# Patient Record
Sex: Male | Born: 1990 | Hispanic: Yes | Smoking: Never smoker
Health system: Southern US, Community
[De-identification: ages and names within clinical notes are randomized; demographics above are authoritative.]

## PROBLEM LIST (undated history)

## (undated) DIAGNOSIS — F419 Anxiety disorder, unspecified: Secondary | ICD-10-CM

## (undated) DIAGNOSIS — G610 Guillain-Barre syndrome: Secondary | ICD-10-CM

## (undated) DIAGNOSIS — F32A Depression, unspecified: Secondary | ICD-10-CM

## (undated) DIAGNOSIS — K219 Gastro-esophageal reflux disease without esophagitis: Secondary | ICD-10-CM

## (undated) HISTORY — DX: Gastro-esophageal reflux disease without esophagitis: K21.9

## (undated) HISTORY — DX: Guillain-Barre syndrome: G61.0

## (undated) HISTORY — DX: Depression, unspecified: F32.A

---

## 2017-05-19 ENCOUNTER — Encounter (HOSPITAL_COMMUNITY): Payer: Self-pay | Admitting: Emergency Medicine

## 2017-05-19 DIAGNOSIS — R51 Headache: Secondary | ICD-10-CM | POA: Insufficient documentation

## 2017-05-19 DIAGNOSIS — R030 Elevated blood-pressure reading, without diagnosis of hypertension: Secondary | ICD-10-CM | POA: Diagnosis not present

## 2017-05-19 DIAGNOSIS — Z7982 Long term (current) use of aspirin: Secondary | ICD-10-CM | POA: Insufficient documentation

## 2017-05-19 NOTE — ED Triage Notes (Signed)
Pt in reporting migraine, nausea, HTN X2-3 days. A/OX4, ambulatory. No neuro deficits. No hx migraine.

## 2017-05-20 ENCOUNTER — Emergency Department (HOSPITAL_COMMUNITY)
Admission: EM | Admit: 2017-05-20 | Discharge: 2017-05-20 | Disposition: A | Discharge status: Home / Self Care | Payer: BLUE CROSS/BLUE SHIELD | Attending: Emergency Medicine | Admitting: Emergency Medicine

## 2017-05-20 DIAGNOSIS — R51 Headache: Secondary | ICD-10-CM

## 2017-05-20 DIAGNOSIS — R030 Elevated blood-pressure reading, without diagnosis of hypertension: Secondary | ICD-10-CM

## 2017-05-20 DIAGNOSIS — R519 Headache, unspecified: Secondary | ICD-10-CM

## 2017-05-20 MED ORDER — SODIUM CHLORIDE 0.9 % IV BOLUS (SEPSIS)
1000.0000 mL | Freq: Once | INTRAVENOUS | Status: AC
  Administered 2017-05-20: 1000 mL via INTRAVENOUS

## 2017-05-20 MED ORDER — DIPHENHYDRAMINE HCL 50 MG/ML IJ SOLN
25.0000 mg | Freq: Once | INTRAMUSCULAR | Status: AC
  Administered 2017-05-20: 25 mg via INTRAVENOUS
  Filled 2017-05-20: qty 1

## 2017-05-20 MED ORDER — ACETAMINOPHEN 325 MG PO TABS
650.0000 mg | ORAL_TABLET | Freq: Once | ORAL | Status: AC
  Administered 2017-05-20: 650 mg via ORAL
  Filled 2017-05-20: qty 2

## 2017-05-20 MED ORDER — METOCLOPRAMIDE HCL 5 MG/ML IJ SOLN
10.0000 mg | Freq: Once | INTRAMUSCULAR | Status: AC
  Administered 2017-05-20: 10 mg via INTRAVENOUS
  Filled 2017-05-20: qty 2

## 2017-05-20 MED ORDER — DEXAMETHASONE SODIUM PHOSPHATE 10 MG/ML IJ SOLN
10.0000 mg | Freq: Once | INTRAMUSCULAR | Status: AC
Start: 2017-05-20 — End: 2017-05-20
  Administered 2017-05-20: 10 mg via INTRAVENOUS
  Filled 2017-05-20: qty 1

## 2017-05-20 NOTE — ED Notes (Signed)
Pt departed in NAD, refused use of wheelchair.  

## 2017-05-20 NOTE — ED Provider Notes (Signed)
[facility] Provider Note   CSN: [id number] Arrival date & time: 05/19/17  2019     History   Chief Complaint Chief Complaint  Patient presents with  . Hypertension  . Migraine    HPI Shawn Cameron is a 26 y.o. male.  Shawn Skeetersaron D Mascaro is a 26 y.o. Male presents the emergency department complaining of a frontal headache ongoing for the past 2 days. Patient reports gradual onset of frontal headache 2 days ago. He also reports checking his blood pressure this morning and noted to be 170 systolic. He's been told he has elevated blood pressures previously but is not on blood pressure medication. He reports taking Excedrin Migraine with little relief of this pain today. He denies any aggravating or alleviating factors for his headache. No exertional headache. Currently he reports his headache is a 5 out of 10 and frontal. He reports hornlike pattern distribution bilaterally of his headache. He also reports some pain to his bilateral shoulders across his trapezius musculature. He reports some associated nausea earlier without vomiting. He reports being more stressed recently. He denies fevers, sudden onset headache, neck pain, neck stiffness, chest pain, shortness of breath, abdominal pain, vomiting, urinary symptoms, numbness, tingling, weakness, changes to his vision, double vision, or rashes.   The history is provided by the patient and medical records. No language interpreter was used.  Hypertension  Associated symptoms include headaches. Pertinent negatives include no chest pain, no abdominal pain and no shortness of breath.  Migraine  Associated symptoms include headaches. Pertinent negatives include no chest pain, no abdominal pain and no shortness of breath.    History reviewed. No pertinent past medical history.  There are no active problems to display for this patient.   History reviewed. No pertinent surgical history.     Home Medications    Prior to Admission  medications   Medication Sig Start Date End Date Taking? Authorizing Provider  aspirin-acetaminophen-caffeine (EXCEDRIN MIGRAINE) [phone] MG tablet Take 1 tablet by mouth every 6 (six) hours as needed for headache.   Yes [provider]    Family History No family history on file.  Social History Social History  Substance Use Topics  . Smoking status: Not on file  . Smokeless tobacco: Not on file  . Alcohol use Not on file     Allergies   Patient has no known allergies.   Review of Systems Review of Systems  Constitutional: Negative for chills and fever.  HENT: Negative for congestion and sore throat.   Eyes: Negative for pain and visual disturbance.  Respiratory: Negative for cough, shortness of breath and wheezing.   Cardiovascular: Negative for chest pain and palpitations.  Gastrointestinal: Negative for abdominal pain, diarrhea, nausea and vomiting.  Genitourinary: Negative for dysuria.  Musculoskeletal: Positive for myalgias. Negative for back pain, neck pain and neck stiffness.  Skin: Negative for rash and wound.  Neurological: Positive for headaches. Negative for light-headedness.     Physical Exam Updated Vital Signs BP 129/85   Pulse 83   Temp 98.2 F (36.8 C) (Oral)   Resp 18   Ht 5\' 11"  (1.803 m)   Wt 104.3 kg (230 lb)   SpO2 99%   BMI 32.08 kg/m   Physical Exam  Constitutional: He is oriented to person, place, and time. He appears well-developed and well-nourished. No distress.  Nontoxic appearing.  HENT:  Head: Normocephalic and atraumatic.  Right Ear: External ear normal.  Left Ear: External ear normal.  Mouth/Throat: Oropharynx  is clear and moist.  Bilateral tympanic membranes are pearly-gray without erythema or loss of landmarks.  No temporal edema or tenderness.  Eyes: Conjunctivae and EOM are normal. Pupils are equal, round, and reactive to light. Right eye exhibits no discharge. Left eye exhibits no discharge.  Neck: Normal  range of motion. Neck supple. No JVD present. No tracheal deviation present.  No meningeal signs.   Cardiovascular: Normal rate, regular rhythm, normal heart sounds and intact distal pulses.  Exam reveals no gallop and no friction rub.   No murmur heard. Pulmonary/Chest: Effort normal and breath sounds normal. No stridor. No respiratory distress. He has no wheezes. He has no rales.  Lungs are clear to ascultation bilaterally. Symmetric chest expansion bilaterally. No increased work of breathing. No rales or rhonchi.    Abdominal: Soft. Bowel sounds are normal. There is no tenderness. There is no guarding.  Abdomen is soft nontender to palpation.  Musculoskeletal: Normal range of motion. He exhibits tenderness. He exhibits no edema.  TTP across his bilateral trapezius musculature. Patient is spontaneously moving all extremities in a coordinated fashion exhibiting good strength.   Lymphadenopathy:    He has no cervical adenopathy.  Neurological: He is alert and oriented to person, place, and time. He displays normal reflexes. No cranial nerve deficit or sensory deficit. He exhibits normal muscle tone. Coordination normal.  The patient is alert and oriented 3. Cranial nerves are intact. Speech is clear and coherent. No pronator drift. Finger to nose intact bilaterally. EOMs are intact. Vision is grossly intact. Normal gait. Sensation is intact his bilateral upper and lower extremities. Bilateral patellar DTRs are intact.  Skin: Skin is warm and dry. Capillary refill takes less than 2 seconds. No rash noted. He is not diaphoretic. No erythema. No pallor.  Psychiatric: He has a normal mood and affect. His behavior is normal.  Nursing note and vitals reviewed.    ED Treatments / Results  Labs (all labs ordered are listed, but only abnormal results are displayed) Labs Reviewed - No data to display  EKG  EKG Interpretation None       Radiology No results found.  Procedures Procedures  (including critical care time)  Medications Ordered in ED Medications  sodium chloride 0.9 % bolus 1,000 mL (1,000 mLs Intravenous New Bag/Given 05/20/17 0510)  metoCLOPramide (REGLAN) injection 10 mg (10 mg Intravenous Given 05/20/17 0511)  diphenhydrAMINE (BENADRYL) injection 25 mg (25 mg Intravenous Given 05/20/17 0515)  dexamethasone (DECADRON) injection 10 mg (10 mg Intravenous Given 05/20/17 0513)  acetaminophen (TYLENOL) tablet 650 mg (650 mg Oral Given 05/20/17 0510)     Initial Impression / Assessment and Plan / ED Course  I have reviewed the triage vital signs and the nursing notes.  Pertinent labs & imaging results that were available during my care of the patient were reviewed by me and considered in my medical decision making (see chart for details).    This is a 26 y.o. Male presents the emergency department complaining of a frontal headache ongoing for the past 2 days. Patient reports gradual onset of frontal headache 2 days ago. He also reports checking his blood pressure this morning and noted to be 170 systolic. He's been told he has elevated blood pressures previously but is not on blood pressure medication. He reports taking Excedrin Migraine with little relief of this pain today. He denies any aggravating or alleviating factors for his headache. No exertional headache. Currently he reports his headache is a 5 out of  10 and frontal. He reports hornlike pattern distribution bilaterally of his headache. He also reports some pain to his bilateral shoulders across his trapezius musculature. He reports some associated nausea earlier without vomiting. He reports being more stressed recently. He denies fevers, sudden onset headache, neck pain, neck stiffness. . On exam the patient is afebrile and non-toxic appearing. He has no focal neurological deficits. Blood pressure is 124/79 on my exam. He does have some tenderness across his trapezius musculature bilaterally. Suspect this could cause  tension headache as well. Presentation is not concerning for [facility], ICH, Meningitis, or temporal arteritis. Pt is afebrile with no focal neuro deficits, nuchal rigidity, or change in vision.  I reevaluation patient reports his headache is completely resolved after migraine cocktail. Plan for discharge. I did encourage him to follow-up with his primary care doctor to have his blood pressure rechecked. I suspect his blood pressure was elevated today due to some pain. Will have him recheck this to ensure this is due to pain. Prior to discharge his blood pressure is 116/69. I advised the patient to follow-up with their primary care provider this week. I advised the patient to return to the emergency department with new or worsening symptoms or new concerns. The patient verbalized understanding and agreement with plan.      Final Clinical Impressions(s) / ED Diagnoses   Final diagnoses:  Bad headache  Elevated blood pressure reading    New Prescriptions New Prescriptions   No medications on file     Everlene Farrier, Cordelia Poche 05/20/17 1610    Devoria Albe, MD 05/20/17 [phone]

## 2017-05-21 ENCOUNTER — Emergency Department (HOSPITAL_COMMUNITY): Payer: BLUE CROSS/BLUE SHIELD

## 2017-05-21 ENCOUNTER — Emergency Department (HOSPITAL_COMMUNITY)
Admission: EM | Admit: 2017-05-21 | Discharge: 2017-05-21 | Disposition: A | Discharge status: Home / Self Care | Payer: BLUE CROSS/BLUE SHIELD | Attending: Emergency Medicine | Admitting: Emergency Medicine

## 2017-05-21 ENCOUNTER — Encounter (HOSPITAL_COMMUNITY): Payer: Self-pay

## 2017-05-21 DIAGNOSIS — R0789 Other chest pain: Secondary | ICD-10-CM | POA: Insufficient documentation

## 2017-05-21 DIAGNOSIS — G4489 Other headache syndrome: Secondary | ICD-10-CM | POA: Diagnosis not present

## 2017-05-21 DIAGNOSIS — R51 Headache: Secondary | ICD-10-CM | POA: Diagnosis present

## 2017-05-21 LAB — I-STAT TROPONIN, ED: TROPONIN I, POC: 0.01 ng/mL (ref 0.00–0.08)

## 2017-05-21 MED ORDER — SODIUM CHLORIDE 0.9 % IV BOLUS (SEPSIS)
1000.0000 mL | Freq: Once | INTRAVENOUS | Status: AC
  Administered 2017-05-21: 1000 mL via INTRAVENOUS

## 2017-05-21 MED ORDER — KETOROLAC TROMETHAMINE 30 MG/ML IJ SOLN
30.0000 mg | Freq: Once | INTRAMUSCULAR | Status: AC
  Administered 2017-05-21: 30 mg via INTRAVENOUS
  Filled 2017-05-21: qty 1

## 2017-05-21 MED ORDER — DIPHENHYDRAMINE HCL 50 MG/ML IJ SOLN
25.0000 mg | Freq: Once | INTRAMUSCULAR | Status: AC
  Administered 2017-05-21: 25 mg via INTRAVENOUS
  Filled 2017-05-21: qty 1

## 2017-05-21 MED ORDER — METOCLOPRAMIDE HCL 5 MG/ML IJ SOLN
10.0000 mg | Freq: Once | INTRAMUSCULAR | Status: AC
  Administered 2017-05-21: 10 mg via INTRAVENOUS
  Filled 2017-05-21: qty 2

## 2017-05-21 NOTE — ED Triage Notes (Signed)
Pt was seen last night for headache which has returned. Pt also c/o of central CP that started today with some nausea, denies SOB or radiation.

## 2017-05-21 NOTE — ED Provider Notes (Signed)
By signing my name below, I, Shawn Cameron, attest that this documentation has been prepared under the direction and in the presence of [name], [name], DO. Electronically Signed: Diona Cameron, ED Scribe. 05/21/17. 3:54 AM.  TIME SEEN: 3:35 AM  CHIEF COMPLAINT: Headache  HPI: Shawn Cameron is a 26 y.o. 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 Pt was seen on 05/19/17 for similar sx and was given a migraine cocktail which alleviated his sx. Around 3 pm on 05/20/17, he took Excedrin with mild relief as he states he felt his headache was coming back. States tonight around 6 PM headache started coming back again has diffuse, throbbing in nature. Reports tingling in both hands and on the left side of the face. No focal weakness. No head injury. Not on anticoagulation or antiplatelets. Has had similar headaches before. Denies at this was a sudden onset, worst headache of his life, thunderclap headache. States he does have some pressure behind both eyes and nausea but no vomiting. No aggravating or alleviating factors for his headache other than Excedrin, migraine cocktail.  Additionally he c/o of a gradually worsening, mild central CP that started ~ 2 days ago. Applying pressure to his chest temporarily alleviates his discomfort. States he is only able to describe this as a discomfort.  He reports being stressed and anxious and he thinks this is making his symptoms worse.  No history of PE, DVT, exogenous estrogen use, recent fractures, surgery, trauma, hospitalization or prolonged travel. No lower extremity swelling or pain. No calf tenderness. Denies family history of premature CAD. No fevers or cough.  ROS: See HPI Constitutional: no fever  Eyes: no drainage  ENT: no runny nose   Cardiovascular: + chest pain  Resp: no SOB  GI: no  vomiting GU: no dysuria Integumentary: no rash  Allergy: no hives  Musculoskeletal: no leg swelling  Neurological: no slurred speech ROS otherwise negative  PAST MEDICAL HISTORY/PAST SURGICAL HISTORY:  History reviewed. No pertinent past medical history.  MEDICATIONS:  Prior to Admission medications   Medication Sig Start Date End Date Taking? Authorizing Provider  aspirin-acetaminophen-caffeine (EXCEDRIN MIGRAINE) [phone] MG tablet Take 1 tablet by mouth every 6 (six) hours as needed for headache.    [provider]    ALLERGIES:  No Known Allergies  SOCIAL HISTORY:  Social History  Substance Use Topics  . Smoking status: Not on file  . Smokeless tobacco: Not on file  . Alcohol use Not on file    FAMILY HISTORY: No family history on file.  EXAM: BP (!) 142/84   Pulse 84   Temp 97.4 F (36.3 C)   Resp 18   SpO2 98%    CONSTITUTIONAL: Alert and oriented and responds appropriately to questions. Well-appearing; well-nourished HEAD: Normocephalic EYES: Conjunctivae clear, pupils appear equal, EOMI ENT: normal nose; moist mucous membranes; No pharyngeal erythema or petechiae, no tonsillar hypertrophy or exudate, no uvular deviation, no unilateral swelling, no trismus or drooling, no muffled voice, normal phonation, no stridor, no dental caries present, no drainable dental abscess noted, no Ludwig's angina, tongue sits flat in the bottom of the mouth, no angioedema, no facial erythema or warmth, no facial swelling; no pain with movement of the neck. NECK: Supple, no meningismus, no nuchal rigidity, no LAD  CARD: RRR; S1 and S2 appreciated; no murmurs,  no clicks, no rubs, no gallops CHEST:  Chest wall is not tender to palpation over the left chest wall but he reports palpating this area makes his pain better.  No crepitus, ecchymosis, erythema, warmth, rash or other lesions present.   RESP: Normal chest excursion without splinting or tachypnea; breath sounds clear  and equal bilaterally; no wheezes, no rhonchi, no rales, no hypoxia or respiratory distress, speaking full sentences ABD/GI: Normal bowel sounds; non-distended; soft, non-tender, no rebound, no guarding, no peritoneal signs, no hepatosplenomegaly BACK:  The back appears normal and is non-tender to palpation, there is no CVA tenderness EXT: Normal ROM in all joints; non-tender to palpation; no edema; normal capillary refill; no cyanosis, no calf tenderness or swelling    SKIN: Normal color for age and race; warm; no rash NEURO: Moves all extremities equally; Strength 5/5 in all four extremities except with the left hand and wrist has decreased strength with grip strength and wrist flexion and extension which is chronic.  Normal sensation diffusely.  CN 2-12 grossly intact.  No dysmetria to finger to nose testing bilaterally.  Normal speech.  Normal gait. PSYCH: The patient's mood and manner are appropriate. Grooming and personal hygiene are appropriate.  MEDICAL DECISION MAKING: Patient here with atypical chest pain. Troponin ordered in triage is unremarkable. Chest x-ray clear. He is PERC negative. Doubt dissection. Suspect chest wall pain. We'll treat with Toradol and reassess. EKG does show some nonspecific intraventricular conduction delay but no other abnormality. No old for comparison.   Patient also complaining of diffuse headache. It was not sudden onset, thunderclap. Not the worst taking his life. He has had similar headaches. Reports tingling in his left face and bilateral hands but on my examination he has normal sensation diffusely. Has some left hand and wrist weakness on my examination which is chronic from his history of Guillain-Barr. No other focal neurologic deficits. Doubt intracranial hemorrhage, meningitis, stroke. I do not feel needs head imaging and have discussed with him that I feel a head CT would expose him to unnecessary radiation at the risk of this test would outweigh any  benefit. He is comfortable with this plan. We'll treat him again with migraine cocktail including Toradol, Reglan, Benadryl, IV fluids and reassess.  ED PROGRESS: Chem-8 shows sodium 139, potassium 3.8, chloride 103, calcium 1.14, CO2 26, glucose 64, BUN 6, creatinine 0.6, hematocrit 31, hemoglobin 10.5, anion gap 15.  5:25 AM  Pt's headache almost completely gone after migraine cocktail. Chest pain is completely resolved. He reports he feels much better and reassured after blood work, chest x-ray and long discussion. I suspect there is a component of anxiety present he agrees. Will get outpatient PCP and neurology follow-up. Have recommend alternating Tylenol and Motrin as needed for pain. Discussed return precautions.   At this time, I do not feel there is any life-threatening condition present. I have reviewed and discussed all results (EKG, imaging, lab, urine as appropriate) and exam findings with patient/family. I have reviewed nursing notes and appropriate previous records.  I feel the patient is safe to be discharged home without further emergent workup and can continue workup as an outpatient as needed. Discussed usual and customary return precautions. Patient/family verbalize understanding and are comfortable with this plan.  Outpatient follow-up has been provided if needed. All questions have been answered.    EKG Interpretation  Date/Time:  Friday May 21 2017 03:04:36 EDT Ventricular Rate:  87 PR Interval:  166 QRS Duration: 126 QT Interval:  386 QTC Calculation: 464 R Axis:   84 Text Interpretation:  Normal sinus rhythm Non-specific intra-ventricular conduction block Abnormal ECG No old tracing to compare Confirmed by [name], Baxter HireKristen [phone] on 05/21/2017 3:13:12 AM      I personally performed the services described in this documentation, which was scribed in my presence. The recorded information has been reviewed and is accurate.     [name], Layla MawKristen N, DO 05/21/17 [phone]

## 2017-05-21 NOTE — Discharge Instructions (Signed)
You may alternate Tylenol 1000 mg every 6 hours as needed for pain and Ibuprofen 800 mg every 8 hours as needed for pain.  Please take Ibuprofen with food. ° ° ° °To find a primary care or specialty doctor please call [phone] or [phone] to access "[facility] Find a Doctor Service." ° °You may also go on the [facility] website at [email or web address] ° °There are also multiple [facility], [facility] practices throughout the [place] that are frequently accepting new patients. You may find a clinic that is close to your home and contact them. ° °[facility] -  °[address] °[place] [address] °[phone] ° ° °[facility] -  °[address] °[place] [address] °[phone] ° ° °[facility] - °[address]  °[place] [address] °[phone] ° ° °

## 2017-06-14 ENCOUNTER — Ambulatory Visit (HOSPITAL_COMMUNITY)
Admission: EM | Admit: 2017-06-14 | Discharge: 2017-06-14 | Disposition: A | Discharge status: Home / Self Care | Payer: BLUE CROSS/BLUE SHIELD | Attending: Family Medicine | Admitting: Family Medicine

## 2017-06-14 ENCOUNTER — Encounter (HOSPITAL_COMMUNITY): Payer: Self-pay | Admitting: Emergency Medicine

## 2017-06-14 DIAGNOSIS — F411 Generalized anxiety disorder: Secondary | ICD-10-CM

## 2017-06-14 HISTORY — DX: Anxiety disorder, unspecified: F41.9

## 2017-06-14 NOTE — Discharge Instructions (Signed)
You do not have cancer. Instead, you have an anxiety disorder which is causing it to worry about possible physical problems. No severe symptoms are coming from her mind telling you that something is wrong.  The easiest way out of this mass isto talk to a counselor and explore reasons why your reasonable thoughts are becoming unreasonable.

## 2017-06-14 NOTE — ED Provider Notes (Signed)
[facility]    CSN: [id number] Arrival date & time: 06/14/17  1350     History   Chief Complaint Chief Complaint  Patient presents with  . Back Pain    HPI Shawn Cameron is a 26 y.o. male.   The patient presented to the [facility] with a complaint of lower back pain x 1 week. The patient also reported pain in the groin area while sitting.   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 He had back pain about a week ago but it's resolved. He now has some aching in his testicles.    patient lives with his fiance. He is a immigrant. He began having panic attacks about a month ago. He is terrified that he might have cancer.      Past Medical History:  Diagnosis Date  . Anxiety     There are no active problems to display for this patient.   History reviewed. No pertinent surgical history.     Home Medications    Prior to Admission medications   Not on File    Family History History reviewed. No pertinent family history.  Social History Social History  Substance Use Topics  . Smoking status: Never Smoker  . Smokeless tobacco: Never Used  . Alcohol use No     Allergies   Patient has no known allergies.   Review of Systems Review of Systems  Gastrointestinal: Positive for abdominal pain.  Genitourinary: Positive for testicular pain.  Musculoskeletal: Positive for back pain.  Psychiatric/Behavioral: Positive for dysphoric mood. The patient is nervous/anxious.   All other systems reviewed and are negative.    Physical Exam Triage Vital Signs ED Triage Vitals  Enc Vitals Group     BP 06/14/17 1426 138/74     Pulse Rate 06/14/17 1426 94     Resp 06/14/17 1426 18     Temp 06/14/17 1426 98.6 F (37 C)     Temp Source 06/14/17 1426 Oral     SpO2 06/14/17 1426 100 %     Weight --      Height --      Head Circumference --      Peak Flow --      Pain Score 06/14/17 1424 3     Pain Loc --      Pain Edu? --        Excl. in GC? --    No data found.   Updated Vital Signs BP 138/74 (BP Location: Right Arm)   Pulse 94   Temp 98.6 F (37 C) (Oral)   Resp 18   SpO2 100%    Physical Exam  Constitutional: He is oriented to person, place, and time. He appears well-developed and well-nourished.  HENT:  Right Ear: External ear normal.  Left Ear: External ear normal.  Eyes: Conjunctivae and EOM are normal. Pupils are equal, round, and reactive to light.  Neck: Normal range of motion. Neck supple.  Cardiovascular: Normal rate, regular rhythm and normal heart sounds.   Pulmonary/Chest: Effort normal and breath sounds normal.  Abdominal: Soft. He exhibits no mass. There is no tenderness. There is no guarding.  Musculoskeletal: Normal range of motion.  Neurological: He is alert and oriented to person, place, and time.  Skin: Skin is warm and dry.  Nursing note and vitals reviewed.    UC Treatments / Results  Labs (all labs ordered are listed, but only abnormal results are displayed) Labs Reviewed -  No data to display  EKG  EKG Interpretation None       Radiology No results found.  Procedures Procedures (including critical care time)  Medications Ordered in UC Medications - No data to display   Initial Impression / Assessment and Plan / UC Course  I have reviewed the triage vital signs and the nursing notes.  Pertinent labs & imaging results that were available during my care of the patient were reviewed by me and considered in my medical decision making (see chart for details).     Final Clinical Impressions(s) / UC Diagnoses   Final diagnoses:  Anxiety state    New Prescriptions New Prescriptions   No medications on file     Elvina SidleLauenstein, Yoland Scherr, MD 06/14/17 1517

## 2017-06-14 NOTE — ED Triage Notes (Signed)
[facility] with a [facility] of lower back pain x 1 week. The patient also reported pain in the groin area while sitting.

## 2019-03-16 IMAGING — DX DG CHEST 2V
2 series · 2 of 2 positions shown · non-contrast
Comparison: None.

CLINICAL DATA: Headache chest pain

EXAM:
CHEST  2 VIEW

[chest pa]
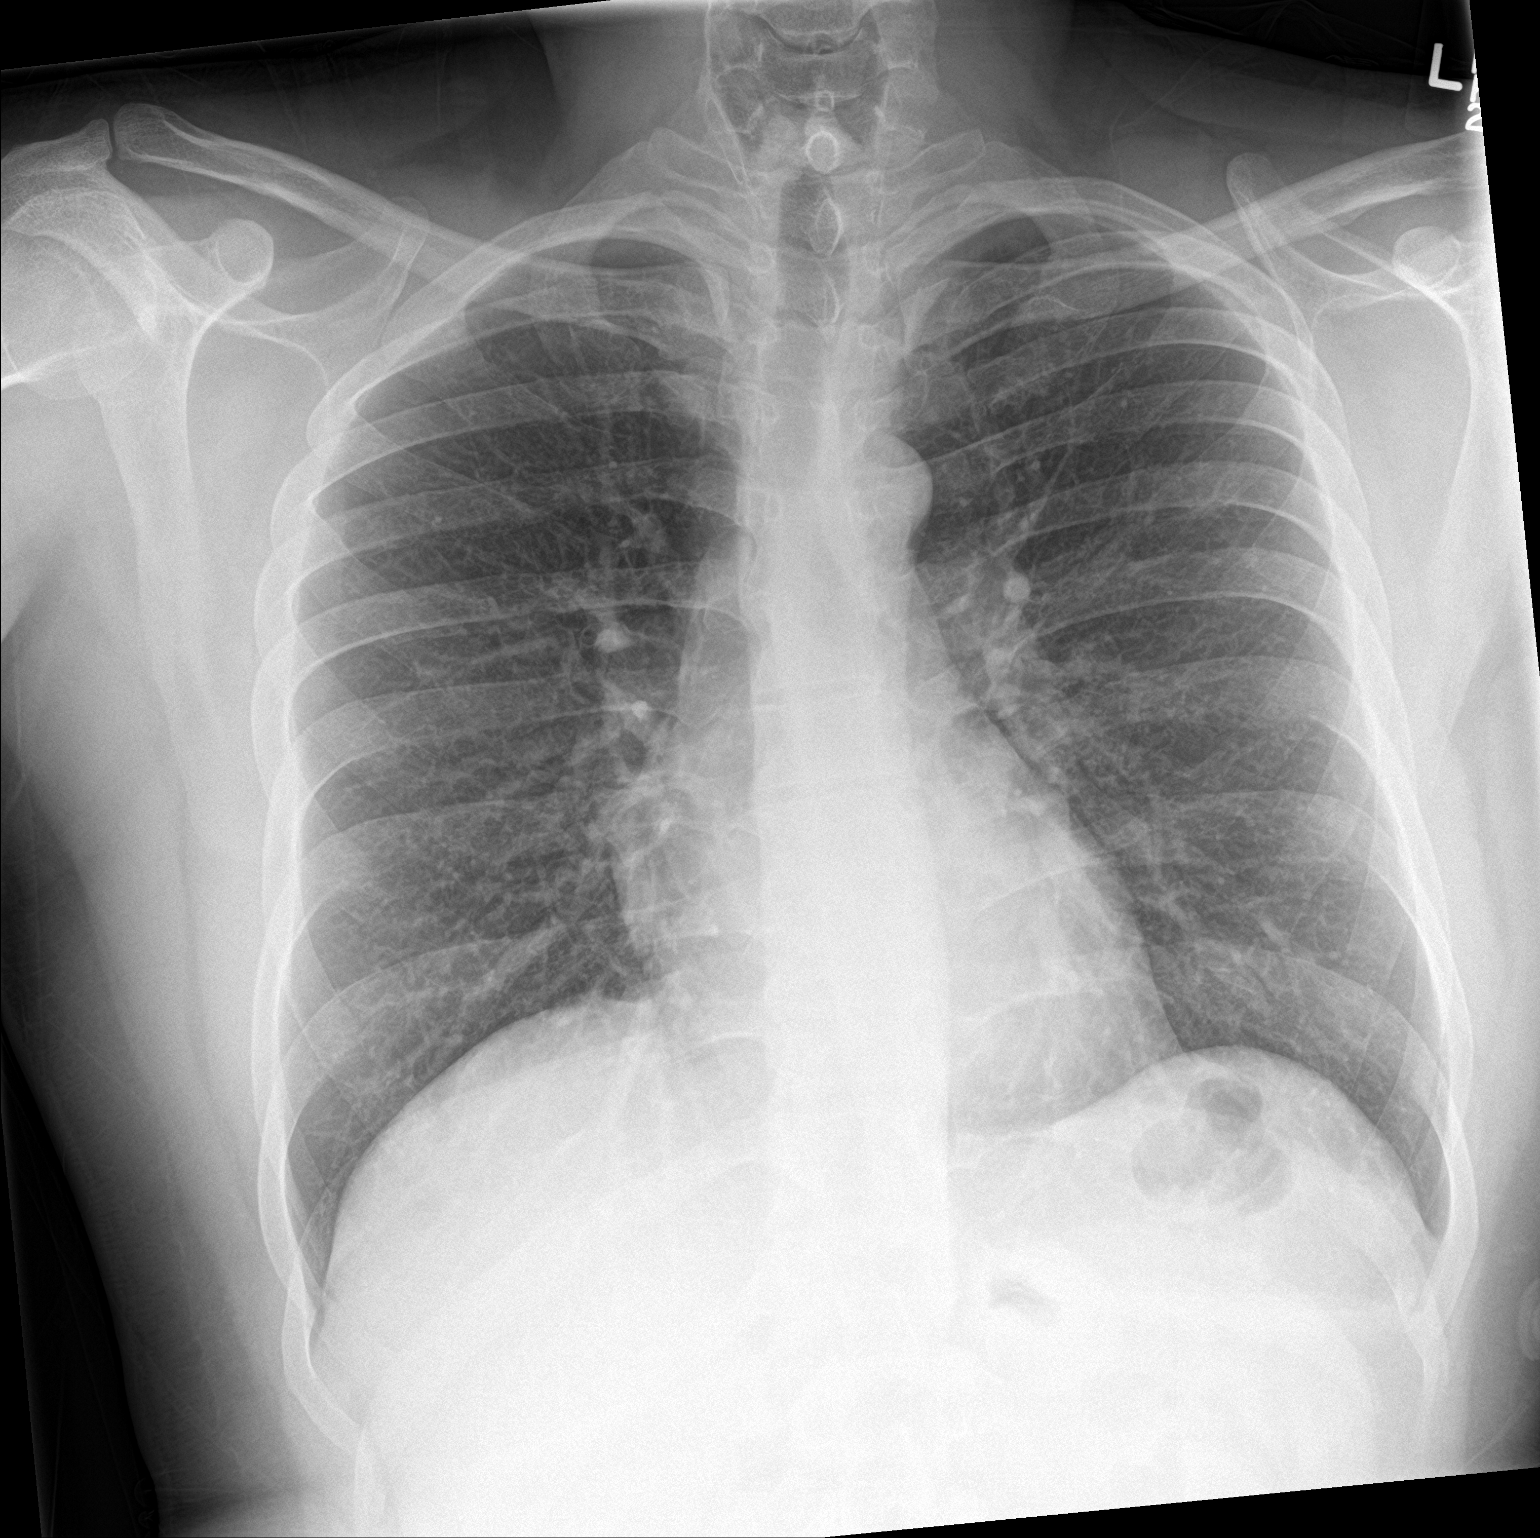

[chest lat]
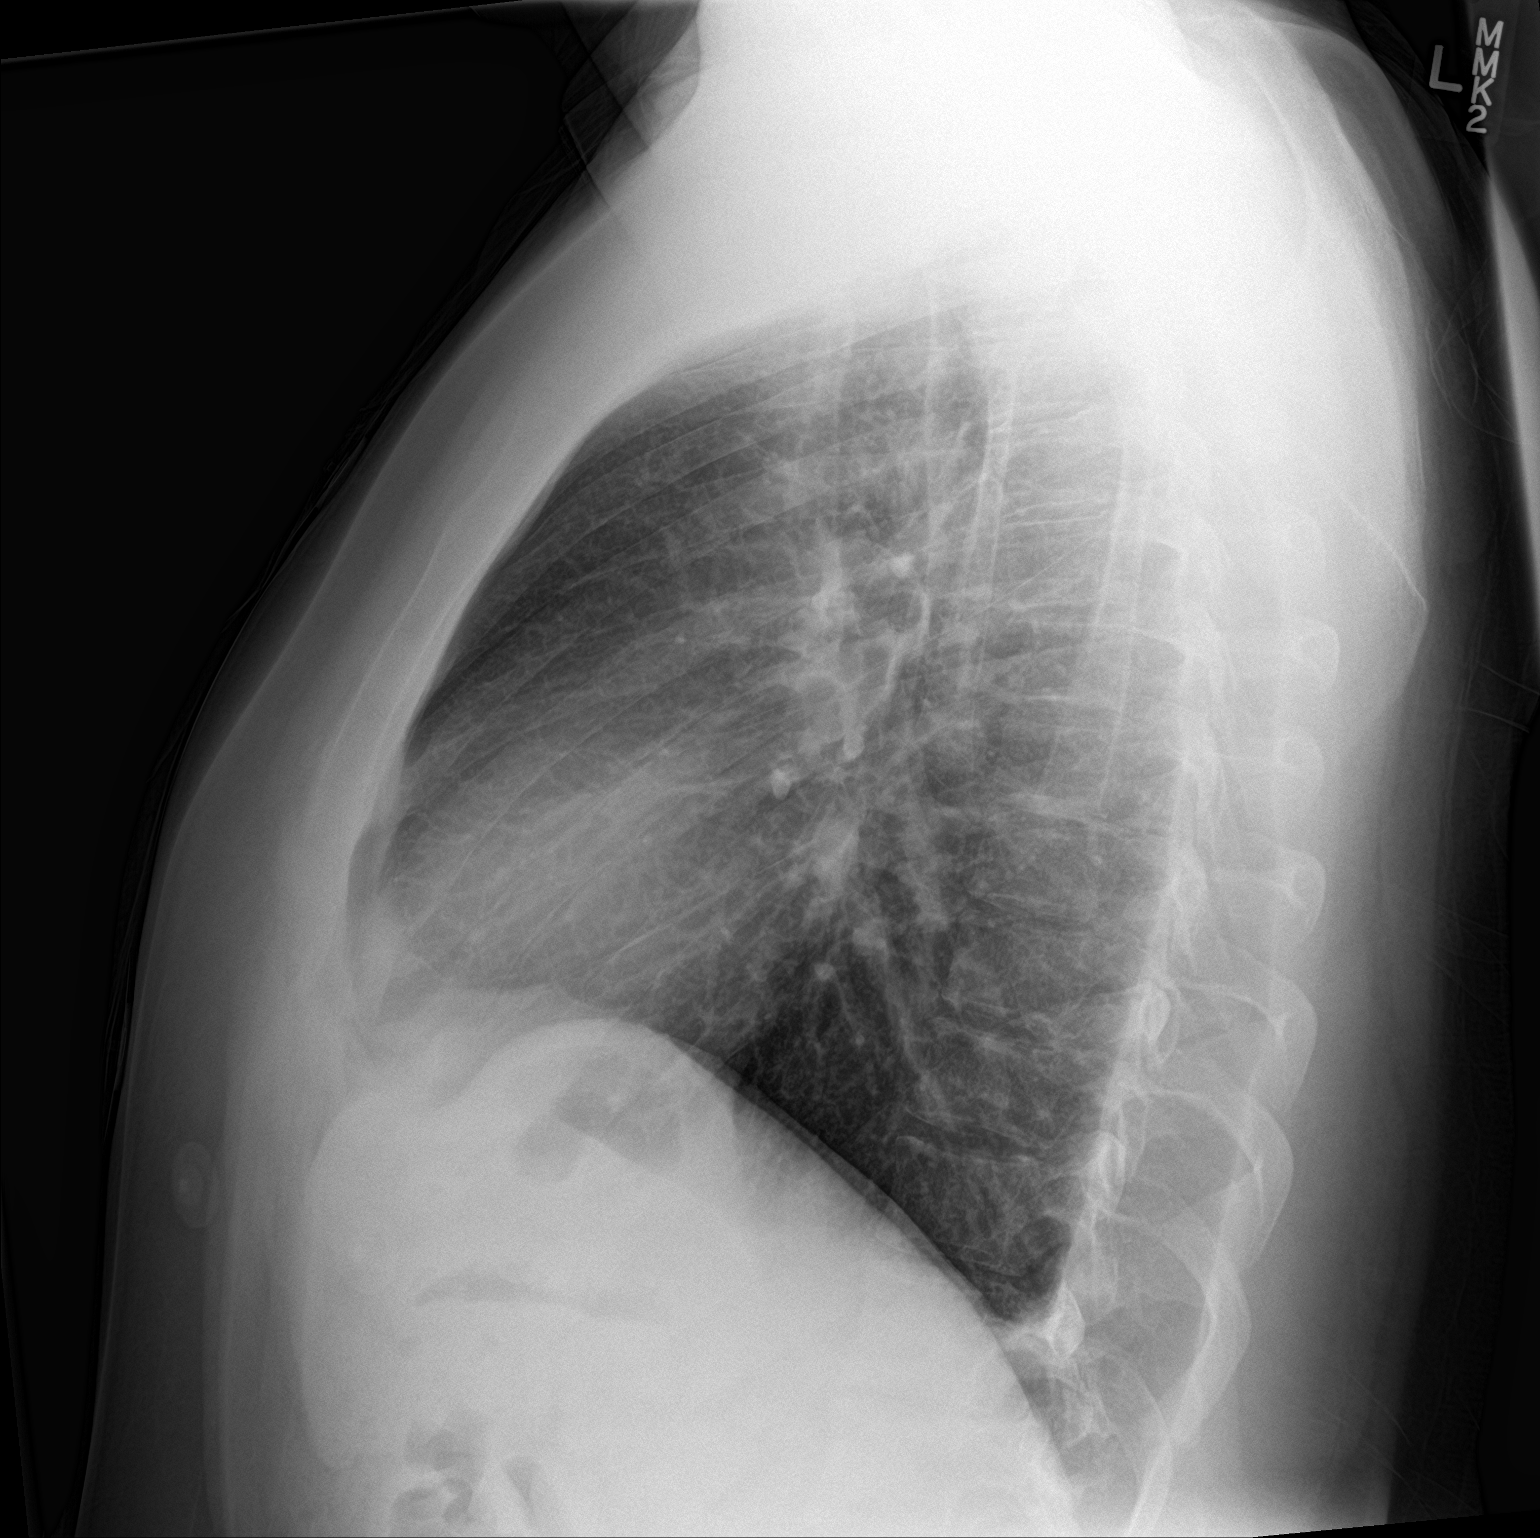

[2 of 2 positions shown; findings below may reference images not displayed]

FINDINGS: The heart size and mediastinal contours are within normal limits.
Both lungs are clear. The visualized skeletal structures are
unremarkable.
IMPRESSION: No active cardiopulmonary disease.

## 2023-09-20 ENCOUNTER — Ambulatory Visit: Payer: Managed Care, Other (non HMO) | Admitting: Family Medicine

## 2023-09-20 ENCOUNTER — Encounter: Payer: Self-pay | Admitting: Family Medicine

## 2023-09-20 VITALS — BP 120/78 | HR 76 | Temp 97.5°F | Ht 70.0 in | Wt 265.8 lb

## 2023-09-20 DIAGNOSIS — Z6838 Body mass index (BMI) 38.0-38.9, adult: Secondary | ICD-10-CM

## 2023-09-20 DIAGNOSIS — M25512 Pain in left shoulder: Secondary | ICD-10-CM | POA: Diagnosis not present

## 2023-09-20 DIAGNOSIS — E6609 Other obesity due to excess calories: Secondary | ICD-10-CM | POA: Diagnosis not present

## 2023-09-20 DIAGNOSIS — Z8669 Personal history of other diseases of the nervous system and sense organs: Secondary | ICD-10-CM | POA: Insufficient documentation

## 2023-09-20 DIAGNOSIS — F419 Anxiety disorder, unspecified: Secondary | ICD-10-CM | POA: Diagnosis not present

## 2023-09-20 MED ORDER — NAPROXEN 500 MG PO TABS
500.0000 mg | ORAL_TABLET | Freq: Two times a day (BID) | ORAL | 0 refills | Status: AC | PRN
Start: 2023-09-20 — End: 2023-10-20

## 2023-09-20 NOTE — Assessment & Plan Note (Signed)
Consider against administering flu vaccine.  However only Shawn [place] COVID-vaccine seems to be associated with increased risk of Guillain-Barr syndrome and patient may pursue this at outside pharmacy.

## 2023-09-20 NOTE — Assessment & Plan Note (Signed)
Patient reports chronic left shoulder pain exacerbated by specific workouts, notably shoulder presses.  Differential diagnosis: Rotator cuff tendinitis Subacromial impingement Labral tear Soft tissue strain  Plan: Prescribe Naproxen 500 mg, to be taken up to twice daily as needed for pain. Recommend continuation and evaluation of current physical therapy sessions. Order baseline X-ray to rule out any bony abnormalities. Advise following up in 4-6 weeks to reassess pain and possibly discuss further imaging (e.g., MRI) or referral to orthopedics if pain persists.
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
## 2023-09-20 NOTE — Patient Instructions (Addendum)
Ensure you avoid any activity that causes pain in your left shoulder. It is important to minimize strain to allow healing.  You can take naproxen 500 mg up to twice a day as needed for pain. This is the same as Aleve.  Continue with the physical therapy sessions if they are already helping. If needed, we can place a referral for outpatient physical therapy.  Use an ice pack or heating pad on the affected shoulder to reduce pain and inflammation, alternating as needed.  Schedule a follow-up visit in a month to reassess your shoulder, complete any necessary blood work, and discuss weight management as well as mood management and possible adjustments to your antidepressant regimen.  For shoulder xray, go to:    [facility] [address] [place], [place], [place] [address] Phone: [phone]

## 2023-09-20 NOTE — Assessment & Plan Note (Signed)
Patient believes recent regular exercise has positively impacted mood.  Plan: Discuss the potential gradual reduction of antidepressants at the next visit while closely monitoring mood changes. Possible referral to psychiatric or behavioral health specialist for thorough evaluation and supervision during medication adjustments.

## 2023-09-20 NOTE — Assessment & Plan Note (Signed)
Significant recent weight loss through diet and exercise. Expresses interest in medication-assisted weight loss (e.g., Ozempic).  Plan: Obtain baseline labs during the next visit to evaluate overall health status. Schedule follow-up to discuss possible weight management medications and review eligibility based on lab results.

## 2023-10-18 ENCOUNTER — Encounter: Payer: Managed Care, Other (non HMO) | Admitting: Family Medicine

## 2023-11-02 ENCOUNTER — Encounter: Payer: Managed Care, Other (non HMO) | Admitting: Family Medicine

## 2023-11-17 ENCOUNTER — Ambulatory Visit: Payer: Managed Care, Other (non HMO) | Admitting: Family Medicine

## 2023-11-17 ENCOUNTER — Encounter: Payer: Self-pay | Admitting: Family Medicine

## 2023-11-17 VITALS — BP 118/80 | HR 97 | Temp 97.6°F | Wt 264.2 lb

## 2023-11-17 DIAGNOSIS — R1031 Right lower quadrant pain: Secondary | ICD-10-CM | POA: Diagnosis not present

## 2023-11-17 DIAGNOSIS — Z23 Encounter for immunization: Secondary | ICD-10-CM

## 2023-11-17 DIAGNOSIS — E66812 Obesity, class 2: Secondary | ICD-10-CM | POA: Diagnosis not present

## 2023-11-17 DIAGNOSIS — Z0000 Encounter for general adult medical examination without abnormal findings: Secondary | ICD-10-CM

## 2023-11-17 DIAGNOSIS — E6609 Other obesity due to excess calories: Secondary | ICD-10-CM | POA: Diagnosis not present

## 2023-11-17 DIAGNOSIS — Z6838 Body mass index (BMI) 38.0-38.9, adult: Secondary | ICD-10-CM | POA: Diagnosis not present

## 2023-11-17 DIAGNOSIS — F4521 Hypochondriasis: Secondary | ICD-10-CM | POA: Diagnosis not present

## 2023-11-17 LAB — CBC WITH DIFFERENTIAL/PLATELET
Basophils Absolute: 0 10*3/uL (ref 0.0–0.1)
Basophils Relative: 0.5 % (ref 0.0–3.0)
Eosinophils Absolute: 0.1 10*3/uL (ref 0.0–0.7)
Eosinophils Relative: 1.3 % (ref 0.0–5.0)
HCT: 45.9 % (ref 39.0–52.0)
Hemoglobin: 15.3 g/dL (ref 13.0–17.0)
Lymphocytes Relative: 35.9 % (ref 12.0–46.0)
Lymphs Abs: 2.8 10*3/uL (ref 0.7–4.0)
MCHC: 33.3 g/dL (ref 30.0–36.0)
MCV: 87.9 fL (ref 78.0–100.0)
Monocytes Absolute: 0.6 10*3/uL (ref 0.1–1.0)
Monocytes Relative: 8.2 % (ref 3.0–12.0)
Neutro Abs: 4.2 10*3/uL (ref 1.4–7.7)
Neutrophils Relative %: 54.1 % (ref 43.0–77.0)
Platelets: 327 10*3/uL (ref 150.0–400.0)
RBC: 5.22 Mil/uL (ref 4.22–5.81)
RDW: 14.3 % (ref 11.5–15.5)
WBC: 7.8 10*3/uL (ref 4.0–10.5)

## 2023-11-17 LAB — MICROALBUMIN / CREATININE URINE RATIO
Creatinine,U: 184 mg/dL
Microalb Creat Ratio: 1.1 mg/g (ref 0.0–30.0)
Microalb, Ur: 2.1 mg/dL — ABNORMAL HIGH (ref 0.0–1.9)

## 2023-11-17 LAB — HEMOGLOBIN A1C: Hgb A1c MFr Bld: 6 % (ref 4.6–6.5)

## 2023-11-17 LAB — LIPID PANEL
Cholesterol: 185 mg/dL (ref 0–200)
HDL: 43.9 mg/dL (ref >39.00)
LDL Cholesterol: 123 mg/dL — ABNORMAL HIGH (ref 0–99)
NonHDL: 141.03
Total CHOL/HDL Ratio: 4
Triglycerides: 89 mg/dL (ref 0.0–149.0)
VLDL: 17.8 mg/dL (ref 0.0–40.0)

## 2023-11-17 LAB — COMPREHENSIVE METABOLIC PANEL
ALT: 21 U/L (ref 0–53)
AST: 12 U/L (ref 0–37)
Albumin: 4.4 g/dL (ref 3.5–5.2)
Alkaline Phosphatase: 75 U/L (ref 39–117)
BUN: 14 mg/dL (ref 6–23)
CO2: 26 meq/L (ref 19–32)
Calcium: 9.2 mg/dL (ref 8.4–10.5)
Chloride: 104 meq/L (ref 96–112)
Creatinine, Ser: 0.88 mg/dL (ref 0.40–1.50)
GFR: 111.96 mL/min (ref >60.00)
Glucose, Bld: 103 mg/dL — ABNORMAL HIGH (ref 70–99)
Potassium: 3.9 meq/L (ref 3.5–5.1)
Sodium: 138 meq/L (ref 135–145)
Total Bilirubin: 0.7 mg/dL (ref 0.2–1.2)
Total Protein: 7.4 g/dL (ref 6.0–8.3)

## 2023-11-17 LAB — TSH: TSH: 1.66 u[IU]/mL (ref 0.35–5.50)

## 2023-11-17 MED ORDER — HYDROXYZINE HCL 10 MG PO TABS
10.0000 mg | ORAL_TABLET | Freq: Three times a day (TID) | ORAL | 0 refills | Status: AC | PRN
Start: 2023-11-17 — End: ?

## 2023-11-17 MED ORDER — ESCITALOPRAM OXALATE 10 MG PO TABS
5.0000 mg | ORAL_TABLET | Freq: Every day | ORAL | 0 refills | Status: DC
Start: 2023-11-17 — End: 2024-06-13

## 2023-11-17 NOTE — Patient Instructions (Signed)
For anxiety, continue escitalopram 5 mg daily.  You may use hydroxyzine 10 mg 3 times a day as needed for anxiety/panic attack.  For right lower quadrant pain, monitor this.  If he develops symptoms of abdominal pain, nausea, vomiting, worsening abdominal pain, or other worrisome symptoms please follow-up in the office.  If severe, go to the emergency department.

## 2023-11-17 NOTE — Assessment & Plan Note (Signed)
Reports intermittent right lower quadrant abdominal discomfort radiating to the groin over the past two weeks. Pain onset correlated with increased physical activity, particularly cycling; likely muscular strain. No associated gastrointestinal symptoms such as constipation, melena, or GI bleeding. Mild tenderness noted on physical examination.  Differential Diagnosis:  Muscular strain Inguinal hernia Less likely: Gastrointestinal pathology (e.g., appendicitis)  Plan: Recommend watchful waiting as symptoms are gradually improving. Educated on signs of complications requiring prompt evaluation (e.g., increased pain, nausea, vomiting). Patient elected to defer imaging studies at this time. Advise to return if symptoms persist or worsen.

## 2023-11-17 NOTE — Assessment & Plan Note (Signed)
Worsening anxiety symptoms; escitalopram therapy resumed.  Plan:  Continue escitalopram 5?mg orally once daily. Prescribe hydroxyzine 10?mg, to be taken up to three times daily as needed for acute anxiety episodes. Encourage resumption of regular counseling sessions. Discussed non-pharmacological strategies for anxiety management, including mindfulness and relaxation techniques. Follow up in two months to reassess anxiety symptoms and medication efficacy. Advise earlier follow-up if symptoms worsen.

## 2023-11-17 NOTE — Assessment & Plan Note (Signed)
Ongoing; patient motivated to address weight.  Plan:  Ordered baseline laboratory tests: CBC, CMP, lipid panel, TSH, liver function tests. Encourage gradual resumption of physical activity as tolerated. Plan to discuss potential weight management strategies, including pharmacotherapy, pending lab results. Review laboratory results when available. Address weight management options at next visit.

## 2023-11-17 NOTE — Progress Notes (Signed)
Assessment  Assessment/Plan:   Problem List Items Addressed This Visit       Other   Class 2 obesity due to excess calories without serious comorbidity with body mass index (BMI) of 38.0 to 38.9 in adult    Ongoing; patient motivated to address weight.  Plan:  Ordered baseline laboratory tests: CBC, CMP, lipid panel, TSH, liver function tests. Encourage gradual resumption of physical activity as tolerated. Plan to discuss potential weight management strategies, including pharmacotherapy, pending lab results. Review laboratory results when available. Address weight management options at next visit.      Relevant Orders   TSH   Lipid panel   Hemoglobin A1c   Microalbumin / creatinine urine ratio   CBC with Differential/Platelet   Comprehensive metabolic panel   Urinalysis w microscopic + reflex cultur   Right lower quadrant abdominal pain    Reports intermittent right lower quadrant abdominal discomfort radiating to the groin over the past two weeks. Pain onset correlated with increased physical activity, particularly cycling; likely muscular strain. No associated gastrointestinal symptoms such as constipation, melena, or GI bleeding. Mild tenderness noted on physical examination.  Differential Diagnosis:  Muscular strain Inguinal hernia Less likely: Gastrointestinal pathology (e.g., appendicitis)  Plan: Recommend watchful waiting as symptoms are gradually improving. Educated on signs of complications requiring prompt evaluation (e.g., increased pain, nausea, vomiting). Patient elected to defer imaging studies at this time. Advise to return if symptoms persist or worsen.      Relevant Orders   Comprehensive metabolic panel   Urinalysis w microscopic + reflex cultur   Illness anxiety disorder    Worsening anxiety symptoms; escitalopram therapy resumed.  Plan:  Continue escitalopram 5?mg orally once daily. Prescribe hydroxyzine 10?mg, to be taken up to three times  daily as needed for acute anxiety episodes. Encourage resumption of regular counseling sessions. Discussed non-pharmacological strategies for anxiety management, including mindfulness and relaxation techniques. Follow up in two months to reassess anxiety symptoms and medication efficacy. Advise earlier follow-up if symptoms worsen.      Relevant Medications   hydrOXYzine (ATARAX) 10 MG tablet   escitalopram (LEXAPRO) 10 MG tablet   Other Visit Diagnoses     Encounter for well adult exam without abnormal findings    -  Primary   Immunization due       Relevant Orders   Tdap vaccine greater than or equal to 7yo IM (Completed)       Medications Discontinued During This Encounter  Medication Reason   escitalopram (LEXAPRO) 10 MG tablet Reorder    Patient Counseling(The following topics were reviewed and/or handout was given):  -Nutrition: Stressed importance of moderation in sodium/caffeine intake, saturated fat and cholesterol, caloric balance, sufficient intake of fresh fruits, vegetables, and fiber.  -Stressed the importance of regular exercise.   -Substance Abuse: Discussed cessation/primary prevention of tobacco, alcohol, or other drug use; driving or other dangerous activities under the influence; availability of treatment for abuse.   -Injury prevention: Discussed safety belts, safety helmets, smoke detector, smoking near bedding or upholstery.   -Sexuality: Discussed sexually transmitted diseases, partner selection, use of condoms, avoidance of unintended pregnancy and contraceptive alternatives.   -Dental health: Discussed importance of regular tooth brushing, flossing, and dental visits.  -Health maintenance and immunizations reviewed. Please refer to Health maintenance section.  Return to care in 1 year for next preventative visit.        Subjective:   Encounter date: 11/17/2023  Chief Complaint: Physical examination, follow-up on mood, and  discussion of weight  management.  History of Present Illness:  Generalized Anxiety Disorder: Patient reports an increase in anxiety over the past two weeks, predominantly related to health concerns. Recently experienced abdominal discomfort, leading to fears of serious illness despite low risk. Restarted escitalopram 5?mg daily one week ago after discontinuation when anxiety was previously managed with exercise. Inquires about alternatives for acute anxiety management; acknowledges provider's policy against prescribing benzodiazepines like clonazepam.  Abdominal Pain: Describes intermittent right lower quadrant discomfort radiating to the groin area for two weeks. Onset followed increased cycling activity; suspects muscle strain. Pain is mild, improving, and not associated with gastrointestinal symptoms. Takes ibuprofen as needed.  Weight Management: Interested in discussing weight management strategies. Notes that anxiety has affected exercise routines. Previously maintained regular gym attendance.  Review of Systems:  General: No fever or unexpected weight changes. Gastrointestinal: Intermittent right lower quadrant pain; no constipation, diarrhea, melena, or gastrointestinal bleeding. Psychiatric: Reports increased anxiety; occasional insomnia managed with trazodone as needed. Musculoskeletal: Mild abdominal discomfort; no other joint or muscle pain. Neurological: No headaches, dizziness, or focal neurological deficits. Cardiovascular/Pulmonary: Denies chest pain, palpitations, or shortness of breath.      11/17/2023    8:36 AM 09/20/2023    8:54 AM  Depression screen PHQ 2/9  Decreased Interest 2 1  Down, Depressed, Hopeless 1 1  PHQ - 2 Score 3 2  Altered sleeping 1 2  Tired, decreased energy 2 1  Change in appetite 1 1  Feeling bad or failure about yourself  2 1  Trouble concentrating 2 1  Moving slowly or fidgety/restless 0 0  Suicidal thoughts 0 0  PHQ-9 Score 11 8  Difficult doing work/chores  Somewhat difficult Somewhat difficult       11/17/2023    8:36 AM 09/20/2023    8:54 AM  GAD 7 : Generalized Anxiety Score  Nervous, Anxious, on Edge 2 2  Control/stop worrying 3 1  Worry too much - different things 2 2  Trouble relaxing 2 2  Restless 2 1  Easily annoyed or irritable 2 2  Afraid - awful might happen 3 2  Total GAD 7 Score 16 12  Anxiety Difficulty Somewhat difficult Somewhat difficult    There are no preventive care reminders to display for this patient.    PMH:  The following were reviewed and entered/updated in epic: Past Medical History:  Diagnosis Date   Anxiety    Depression    GERD (gastroesophageal reflux disease)    Guillain Barr syndrome [facility])     Patient Active Problem List   Diagnosis Date Noted   Right lower quadrant abdominal pain 11/17/2023   Illness anxiety disorder 11/17/2023   Acute pain of left shoulder 09/20/2023   History of Guillain-Barre syndrome 09/20/2023   Anxiety 09/20/2023   Class 2 obesity due to excess calories without serious comorbidity with body mass index (BMI) of 38.0 to 38.9 in adult 09/20/2023    No past surgical history on file.  Family History  Problem Relation Age of Onset   Hypertension Father     Medications- reviewed and updated Outpatient Medications Prior to Visit  Medication Sig Dispense Refill   traZODone (DESYREL) 50 MG tablet Take 50 mg by mouth at bedtime as needed for sleep.     escitalopram (LEXAPRO) 10 MG tablet Take 5 mg by mouth daily.     No facility-administered medications prior to visit.    Allergies  Allergen Reactions   Fluogen [Influenza Virus Vaccine] Other (  See Comments)    Hx of Guillain-Barr syndrome per Patient    Social History   Socioeconomic History   Marital status: Significant Other    Spouse name: Not on file   Number of children: Not on file   Years of education: Not on file   Highest education level: Bachelor's degree (e.g., BA, AB, BS)  Occupational  History   Not on file  Tobacco Use   Smoking status: Never    Passive exposure: Never   Smokeless tobacco: Never  Vaping Use   Vaping status: Never Used  Substance and Sexual Activity   Alcohol use: Yes    Comment: Rarely   Drug use: No   Sexual activity: Yes    Birth control/protection: I.U.D.  Other Topics Concern   Not on file  Social History Narrative   Not on file   Social Determinants of Health   Financial Resource Strain: Low Risk  (11/16/2023)   Overall Financial Resource Strain (CARDIA)    Difficulty of Paying Living Expenses: Not hard at all  Food Insecurity: No Food Insecurity (11/16/2023)   Hunger Vital Sign    Worried About Running Out of Food in the Last Year: Never true    Ran Out of Food in the Last Year: Never true  Transportation Needs: No Transportation Needs (11/16/2023)   PRAPARE - Administrator, Civil Service (Medical): No    Lack of Transportation (Non-Medical): No  Physical Activity: Sufficiently Active (11/16/2023)   Exercise Vital Sign    Days of Exercise per Week: 5 days    Minutes of Exercise per Session: 60 min  Stress: Stress Concern Present (11/16/2023)   [employer] of Occupational Health - Occupational Stress Questionnaire    Feeling of Stress : To some extent  Social Connections: Moderately Isolated (11/16/2023)   Social Connection and Isolation Panel [NHANES]    Frequency of Communication with Friends and Family: Three times a week    Frequency of Social Gatherings with Friends and Family: Once a week    Attends Religious Services: Never    Database administrator or Organizations: No    Attends Engineer, structural: Not on file    Marital Status: Married           Objective:  Physical Exam: BP 118/80 (BP Location: Left Arm, Patient Position: Sitting, Cuff Size: Large)   Pulse 97   Temp 97.6 F (36.4 C) (Temporal)   Wt 264 lb 3.2 oz (119.8 kg)   SpO2 99%   BMI 37.91 kg/m   Body mass index is  37.91 kg/m. Wt Readings from Last 3 Encounters:  11/17/23 264 lb 3.2 oz (119.8 kg)  09/20/23 265 lb 12.8 oz (120.6 kg)  05/19/17 230 lb (104.3 kg)    Physical Exam Constitutional:      General: He is not in acute distress.    Appearance: Normal appearance. He is not ill-appearing or toxic-appearing.  HENT:     Head: Normocephalic and atraumatic.     Right Ear: Hearing, tympanic membrane, ear canal and external ear normal. There is no impacted cerumen.     Left Ear: Hearing, tympanic membrane, ear canal and external ear normal. There is no impacted cerumen.     Nose: Nose normal. No congestion.     Mouth/Throat:     Lips: No lesions.     Mouth: Mucous membranes are moist.     Pharynx: Oropharynx is clear. No oropharyngeal exudate.  Eyes:  General: No scleral icterus.       Right eye: No discharge.        Left eye: No discharge.     Conjunctiva/sclera: Conjunctivae normal.     Pupils: Pupils are equal, round, and reactive to light.  Neck:     Thyroid: No thyroid mass, thyromegaly or thyroid tenderness.  Cardiovascular:     Rate and Rhythm: Normal rate and regular rhythm.     Pulses: Normal pulses.     Heart sounds: Normal heart sounds.  Pulmonary:     Effort: Pulmonary effort is normal. No respiratory distress.     Breath sounds: Normal breath sounds.  Abdominal:     General: Abdomen is flat. Bowel sounds are normal.     Palpations: Abdomen is soft.     Tenderness: There is abdominal tenderness (mild) in the right upper quadrant and right lower quadrant. There is no guarding or rebound.     Hernia: There is no hernia in the umbilical area.  Musculoskeletal:        General: Normal range of motion.     Cervical back: Normal range of motion.     Right lower leg: No edema.     Left lower leg: No edema.  Lymphadenopathy:     Cervical: No cervical adenopathy.  Skin:    General: Skin is warm and dry.     Findings: No rash.  Neurological:     General: No focal deficit  present.     Mental Status: He is alert and oriented to person, place, and time. Mental status is at baseline.     Deep Tendon Reflexes:     Reflex Scores:      Patellar reflexes are 2+ on the right side and 2+ on the left side. Psychiatric:        Mood and Affect: Mood normal.        Behavior: Behavior normal.        Thought Content: Thought content normal.        Judgment: Judgment normal.         At today's visit, we discussed treatment options, associated risk and benefits, and engage in counseling as needed.  Additionally the following were reviewed: Past medical records, past medical and surgical history, family and social background, as well as relevant laboratory results, imaging findings, and specialty notes, where applicable.  This message was generated using dictation software, and as a result, it may contain unintentional typos or errors.  Nevertheless, extensive effort was made to accurately convey at the pertinent aspects of the patient visit.    There may have been are other unrelated non-urgent complaints, but due to the busy schedule and the amount of time already spent with him, time does not permit to address these issues at today's visit. Another appointment may have or has been requested to review these additional issues.     Thomes Dinning, MD, MS

## 2023-11-18 LAB — URINALYSIS W MICROSCOPIC + REFLEX CULTURE
Bacteria, UA: NONE SEEN /[HPF]
Bilirubin Urine: NEGATIVE
Glucose, UA: NEGATIVE
Hgb urine dipstick: NEGATIVE
Hyaline Cast: NONE SEEN /[LPF]
Ketones, ur: NEGATIVE
Leukocyte Esterase: NEGATIVE
Nitrites, Initial: NEGATIVE
Protein, ur: NEGATIVE
RBC / HPF: NONE SEEN /[HPF] (ref 0–2)
Specific Gravity, Urine: 1.024 (ref 1.001–1.035)
Squamous Epithelial / HPF: NONE SEEN /[HPF] (ref <=5)
WBC, UA: NONE SEEN /[HPF] (ref 0–5)
pH: 5.5 (ref 5.0–8.0)

## 2023-11-18 LAB — NO CULTURE INDICATED

## 2024-04-03 ENCOUNTER — Other Ambulatory Visit: Payer: Self-pay | Admitting: Family Medicine

## 2024-04-03 ENCOUNTER — Telehealth: Admitting: Physician Assistant

## 2024-04-03 DIAGNOSIS — J069 Acute upper respiratory infection, unspecified: Secondary | ICD-10-CM | POA: Diagnosis not present

## 2024-04-03 MED ORDER — FLUTICASONE PROPIONATE 50 MCG/ACT NA SUSP: 2.0000 | Freq: Every day | NASAL | 0 refills | Status: AC

## 2024-04-03 MED ORDER — FLUTICASONE PROPIONATE 50 MCG/ACT NA SUSP: 2.0000 | Freq: Every day | NASAL | 0 refills | Status: DC

## 2024-04-03 MED ORDER — PSEUDOEPH-BROMPHEN-DM 30-2-10 MG/5ML PO SYRP: 5.0000 mL | ORAL_SOLUTION | Freq: Four times a day (QID) | ORAL | 0 refills | Status: AC | PRN

## 2024-04-03 MED ORDER — PSEUDOEPH-BROMPHEN-DM 30-2-10 MG/5ML PO SYRP: 5.0000 mL | ORAL_SOLUTION | Freq: Four times a day (QID) | ORAL | 0 refills | Status: DC | PRN

## 2024-04-03 NOTE — Addendum Note (Signed)
 Addended by: Angelia Kelp on: 04/03/2024 05:45 PM   Modules accepted: Orders

## 2024-04-03 NOTE — Progress Notes (Signed)
 E-Visit for Upper Respiratory Infection   We are sorry you are not feeling well.  Here is how we plan to help!  Based on what you have shared with me, it looks like you may have a viral upper respiratory infection.  Upper respiratory infections are caused by a large number of viruses; however, rhinovirus is the most common cause.   Symptoms vary from person to person, with common symptoms including sore throat, cough, fatigue or lack of energy and feeling of general discomfort.  A low-grade fever of up to 100.4 may present, but is often uncommon.  Symptoms vary however, and are closely related to a person's age or underlying illnesses.  The most common symptoms associated with an upper respiratory infection are nasal discharge or congestion, cough, sneezing, headache and pressure in the ears and face.  These symptoms usually persist for about 3 to 10 days, but can last up to 2 weeks.  It is important to know that upper respiratory infections do not cause serious illness or complications in most cases.    Upper respiratory infections can be transmitted from person to person, with the most common method of transmission being a person's hands.  The virus is able to live on the skin and can infect other persons for up to 2 hours after direct contact.  Also, these can be transmitted when someone coughs or sneezes; thus, it is important to cover the mouth to reduce this risk.  To keep the spread of the illness at bay, good hand hygiene is very important.  This is an infection that is most likely caused by a virus. There are no specific treatments other than to help you with the symptoms until the infection runs its course.  We are sorry you are not feeling well.  Here is how we plan to help!   For nasal congestion, you may use an oral decongestants such as Mucinex D or if you have glaucoma or high blood pressure use plain Mucinex.  Saline nasal spray or nasal drops can help and can safely be used as often as  needed for congestion.  For your congestion, I have prescribed Fluticasone nasal spray one spray in each nostril twice a day  If you do not have a history of heart disease, hypertension, diabetes or thyroid disease, prostate/bladder issues or glaucoma, you may also use Sudafed to treat nasal congestion.  It is highly recommended that you consult with a pharmacist or your primary care physician to ensure this medication is safe for you to take.     For cough I have prescribed for you Bromfed DM cough syrup Take 5mL every 6 hours as needed for cough.  If you have a sore or scratchy throat, use a saltwater gargle-  to  teaspoon of salt dissolved in a 4-ounce to 8-ounce glass of warm water.  Gargle the solution for approximately 15-30 seconds and then spit.  It is important not to swallow the solution.  You can also use throat lozenges/cough drops and Chloraseptic spray to help with throat pain or discomfort.  Warm or cold liquids can also be helpful in relieving throat pain.  For headache, pain or general discomfort, you can use Ibuprofen or Tylenol as directed.   Some authorities believe that zinc sprays or the use of Echinacea may shorten the course of your symptoms.   HOME CARE Only take medications as instructed by your medical team. Be sure to drink plenty of fluids. Water is fine as well  as fruit juices, sodas and electrolyte beverages. You may want to stay away from caffeine or alcohol. If you are nauseated, try taking small sips of liquids. How do you know if you are getting enough fluid? Your urine should be a pale yellow or almost colorless. Get rest. Taking a steamy shower or using a humidifier may help nasal congestion and ease sore throat pain. You can place a towel over your head and breathe in the steam from hot water coming from a faucet. Using a saline nasal spray works much the same way. Cough drops, hard candies and sore throat lozenges may ease your cough. Avoid close contacts  especially the very young and the elderly Cover your mouth if you cough or sneeze Always remember to wash your hands.   GET HELP RIGHT AWAY IF: You develop worsening fever. If your symptoms do not improve within 10 days You develop yellow or green discharge from your nose over 3 days. You have coughing fits You develop a severe head ache or visual changes. You develop shortness of breath, difficulty breathing or start having chest pain Your symptoms persist after you have completed your treatment plan  MAKE SURE YOU  Understand these instructions. Will watch your condition. Will get help right away if you are not doing well or get worse.  Thank you for choosing an e-visit.  Your e-visit answers were reviewed by a board certified advanced clinical practitioner to complete your personal care plan. Depending upon the condition, your plan could have included both over the counter or prescription medications.  Please review your pharmacy choice. Make sure the pharmacy is open so you can pick up prescription now. If there is a problem, you may contact your provider through [employer] and have the prescription routed to another pharmacy.  Your safety is important to [place]. If you have drug allergies check your prescription carefully.   For the next 24 hours you can use MyChart to ask questions about today's visit, request a non-urgent call back, or ask for a work or school excuse. You will get an email in the next two days asking about your experience. I hope that your e-visit has been valuable and will speed your recovery.     I have spent 5 minutes in review of e-visit questionnaire, review and updating patient chart, medical decision making and response to patient.   Margaretann Loveless, PA-C

## 2024-06-13 ENCOUNTER — Telehealth: Payer: Self-pay | Admitting: Family Medicine

## 2024-06-13 DIAGNOSIS — F4521 Hypochondriasis: Secondary | ICD-10-CM

## 2024-06-13 NOTE — Telephone Encounter (Signed)
 Copied from CRM [id number]. Topic: Clinical - Medication Refill >> Jun 13, 2024  1:06 PM Leah C wrote: Medication: escitalopram  (LEXAPRO ) 10 MG tablet- 90 day supply  Has the patient contacted their pharmacy? Yes- Patient stated they called [facility] and the pharmacy did not receive the refill. They stated they will send over a fax to the clinic to fill out to make sure that the patient is being seen by the provider because they don't have that on file.   This is the patient's preferred pharmacy:  [facility] - [place], [place] - [address] [place] [address] Phone: [phone] Fax: [phone]    Is this the correct pharmacy for this prescription? Yes If no, delete pharmacy and type the correct one.   Has the prescription been filled recently? No  Is the patient out of the medication? Yes  Has the patient been seen for an appointment in the last year OR does the patient have an upcoming appointment? Yes  Can we respond through MyChart? Yes  Agent: Please be advised that Rx refills may take up to 3 business days. We ask that you follow-up with your pharmacy.

## 2024-06-14 MED ORDER — ESCITALOPRAM OXALATE 10 MG PO TABS: 5.0000 mg | ORAL_TABLET | Freq: Every day | ORAL | 0 refills | Status: DC

## 2024-06-14 NOTE — Telephone Encounter (Signed)
 Requesting: escitalopram  (LEXAPRO ) 10 MG tablet  Last Visit: 11/17/2023 Next Visit: Visit date not found Last Refill: 11/07/2023  Please Advise

## 2024-09-12 ENCOUNTER — Other Ambulatory Visit: Payer: Self-pay | Admitting: Family Medicine

## 2024-09-12 DIAGNOSIS — F4521 Hypochondriasis: Secondary | ICD-10-CM
